# Patient Record
Sex: Male | Born: 1965 | Hispanic: No | Marital: Single | Smoking: Never smoker
Health system: Southern US, Community
[De-identification: ages and names within clinical notes are randomized; demographics above are authoritative.]

## PROBLEM LIST (undated history)

## (undated) HISTORY — PX: OTHER SURGICAL HISTORY: SHX169

---

## 2005-02-27 ENCOUNTER — Inpatient Hospital Stay (HOSPITAL_COMMUNITY): Admission: AD | Admit: 2005-02-27 | Discharge: 2005-03-01 | Payer: Self-pay | Admitting: Psychiatry

## 2005-02-28 ENCOUNTER — Ambulatory Visit: Payer: Self-pay | Admitting: Psychiatry

## 2005-03-21 ENCOUNTER — Ambulatory Visit (HOSPITAL_COMMUNITY): Payer: Self-pay | Admitting: Psychiatry

## 2006-08-05 ENCOUNTER — Encounter: Admission: RE | Admit: 2006-08-05 | Discharge: 2006-08-05 | Payer: Self-pay | Admitting: Internal Medicine
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
## 2013-01-11 ENCOUNTER — Encounter: Payer: Self-pay | Admitting: Gastroenterology

## 2013-03-21 ENCOUNTER — Other Ambulatory Visit: Payer: Self-pay | Admitting: Gastroenterology

## 2013-04-25 ENCOUNTER — Ambulatory Visit (AMBULATORY_SURGERY_CENTER): Payer: Self-pay | Admitting: *Deleted

## 2013-04-25 ENCOUNTER — Telehealth: Payer: Self-pay | Admitting: *Deleted

## 2013-04-25 VITALS — Ht 75.0 in | Wt 193.0 lb

## 2013-04-25 DIAGNOSIS — Z800 Family history of malignant neoplasm of digestive organs: Secondary | ICD-10-CM

## 2013-04-25 MED ORDER — MOVIPREP 100 G PO SOLR: ORAL | Status: DC

## 2013-04-25 NOTE — Telephone Encounter (Signed)
Dr Ardis Hughs: Pt is scheduled for direct colonoscopy referred by Dr. Brigitte Pulse.  He is 48 year old caucasian with family hx colon cancer in father at age 38.  Paternal grandmother with colon cancer at age 44.  Should pt have colonoscopy now?  Thanks, Juliann Pulse

## 2013-04-25 NOTE — Telephone Encounter (Signed)
Yes, he should have started colon cancer screening at age 48.  So now is good. t ahnks

## 2013-04-25 NOTE — Telephone Encounter (Signed)
Pt notified to proceed with colonoscopy as scheduled 

## 2013-04-25 NOTE — Progress Notes (Signed)
No allergies to eggs or soy. No prior anesthesia.  

## 2013-04-28 ENCOUNTER — Encounter: Payer: Self-pay | Admitting: Gastroenterology

## 2013-05-09 ENCOUNTER — Encounter: Payer: Self-pay | Admitting: Gastroenterology

## 2013-05-09 ENCOUNTER — Ambulatory Visit (AMBULATORY_SURGERY_CENTER): Payer: 59 | Admitting: Gastroenterology

## 2013-05-09 VITALS — BP 132/76 | HR 64 | Temp 97.9°F | Resp 19 | Ht 75.0 in | Wt 193.0 lb

## 2013-05-09 DIAGNOSIS — K5730 Diverticulosis of large intestine without perforation or abscess without bleeding: Secondary | ICD-10-CM

## 2013-05-09 DIAGNOSIS — Z1211 Encounter for screening for malignant neoplasm of colon: Secondary | ICD-10-CM

## 2013-05-09 DIAGNOSIS — D126 Benign neoplasm of colon, unspecified: Secondary | ICD-10-CM

## 2013-05-09 DIAGNOSIS — Z800 Family history of malignant neoplasm of digestive organs: Secondary | ICD-10-CM

## 2013-05-09 MED ORDER — SODIUM CHLORIDE 0.9 % IV SOLN: 500.0000 mL | INTRAVENOUS | Status: DC
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
## 2013-05-09 NOTE — Op Note (Signed)
[facility]  [address] [place], [address]   COLONOSCOPY PROCEDURE REPORT  PATIENT: Derek Hale, Derek Hale  MR#: [id number] BIRTHDATE: 04-13-1965 , 47  yrs. old GENDER: Male ENDOSCOPIST: Milus Banister, MD REFERRED BY:W.  Lutricia Feil, M.D. PROCEDURE DATE:  05/09/2013 PROCEDURE:   Colonoscopy with snare polypectomy First Screening Colonoscopy - Avg.  risk and is 50 yrs.  old or older - No.  Prior Negative Screening - Now for repeat screening. N/A  History of Adenoma - Now for follow-up colonoscopy & has been > or = to 3 yrs.  N/A  Polyps Removed Today? Yes. ASA CLASS:   Class II INDICATIONS:elevated risk screening, father had colon cancer (diagnosed in his 15s) MEDICATIONS: Fentanyl 50 mcg IV, Versed 6 mg IV, and These medications were titrated to patient response per physician's verbal order  DESCRIPTION OF PROCEDURE:   After the risks benefits and alternatives of the procedure were thoroughly explained, informed consent was obtained.  A digital rectal exam revealed no abnormalities of the rectum.   The LB [id number]  endoscope was introduced through the anus and advanced to the cecum, which was identified by both the appendix and ileocecal valve. No adverse events experienced.   The quality of the prep was excellent.  The instrument was then slowly withdrawn as the colon was fully examined.  COLON FINDINGS: Two polyps were found, removed and both were sent to pathology.  These were sessile, located is sigmoid section, 3-58mm across, removed with cold snare, sent to pathology.  There were a few small diverticulum in the left colon.  The examination was otherwise normal.  Retroflexed views revealed no abnormalities. The time to cecum=2 minutes 27 seconds.  Withdrawal time=13 minutes 24 seconds.  The scope was withdrawn and the procedure completed. COMPLICATIONS: There were no complications.  ENDOSCOPIC IMPRESSION: Two polyps were found, removed and both  were sent to pathology. These were sessile, located is sigmoid section, 3-7mm across, removed with cold snare, sent to pathology.  There were a few small diverticulum in the left colon.  The examination was otherwise normal.  RECOMMENDATIONS: Given your significant family history of colon cancer (father diagnosed in his 46s), you should have a repeat colonoscopy in 5 years even if the polyps removed today are not precancerous.   eSigned:  Milus Banister, MD 05/09/2013 9:02 AM

## 2013-05-09 NOTE — Patient Instructions (Signed)
YOU HAD AN ENDOSCOPIC PROCEDURE TODAY AT THE [facility]: Refer to the procedure report that was given to you for any specific questions about what was found during the examination.  If the procedure report does not answer your questions, please call your gastroenterologist to clarify.  If you requested that your care partner not be given the details of your procedure findings, then the procedure report has been included in a sealed envelope for you to review at your convenience later.  YOU SHOULD EXPECT: Some feelings of bloating in the abdomen. Passage of more gas than usual.  Walking can help get rid of the air that was put into your GI tract during the procedure and reduce the bloating. If you had a lower endoscopy (such as a colonoscopy or flexible sigmoidoscopy) you may notice spotting of blood in your stool or on the toilet paper. If you underwent a bowel prep for your procedure, then you may not have a normal bowel movement for a few days.  DIET: Your first meal following the procedure should be a light meal and then it is ok to progress to your normal diet.  A half-sandwich or bowl of soup is an example of a good first meal.  Heavy or fried foods are harder to digest and may make you feel nauseous or bloated.  Likewise meals heavy in dairy and vegetables can cause extra gas to form and this can also increase the bloating.  Drink plenty of fluids but you should avoid alcoholic beverages for 24 hours.  ACTIVITY: Your care partner should take you home directly after the procedure.  You should plan to take it easy, moving slowly for the rest of the day.  You can resume normal activity the day after the procedure however you should NOT DRIVE or use heavy machinery for 24 hours (because of the sedation medicines used during the test).    SYMPTOMS TO REPORT IMMEDIATELY: A gastroenterologist can be reached at any hour.  During normal business hours, 8:30 AM to 5:00 PM Monday through Friday,  call [phone].  After hours and on weekends, please call the GI answering service at [phone] who will take a message and have the physician on call contact you.   Following lower endoscopy (colonoscopy or flexible sigmoidoscopy):  Excessive amounts of blood in the stool  Significant tenderness or worsening of abdominal pains  Swelling of the abdomen that is new, acute  Fever of 100F or higher  FOLLOW UP: If any biopsies were taken you will be contacted by phone or by letter within the next 1-3 weeks.  Call your gastroenterologist if you have not heard about the biopsies in 3 weeks.  Our staff will call the home number listed on your records the next business day following your procedure to check on you and address any questions or concerns that you may have at that time regarding the information given to you following your procedure. This is a courtesy call and so if there is no answer at the home number and we have not heard from you through the emergency physician on call, we will assume that you have returned to your regular daily activities without incident.  SIGNATURES/CONFIDENTIALITY: You and/or your care partner have signed paperwork which will be entered into your electronic medical record.  These signatures attest to the fact that that the information above on your After Visit Summary has been reviewed and is understood.  Full responsibility of the confidentiality of this   discharge information lies with you and/or your care-partner.  Handout on polyps, diverticulosis, high fiber diet Repeat colon in 5 years

## 2013-05-10 ENCOUNTER — Telehealth: Payer: Self-pay

## 2013-05-10 NOTE — Telephone Encounter (Signed)
Attempt follow up call post procedure, no answer or vm to leave message

## 2013-05-27 ENCOUNTER — Telehealth: Payer: Self-pay | Admitting: Gastroenterology

## 2013-05-27 NOTE — Telephone Encounter (Signed)
I spoke with him today on the phone about the fact that the polyp tissue was lost. I explained that these were both small polyps 3-6 mm. They did not at all appear cancerous. I am very comfortable with my recommendation given his family history of colon cancer in his father that he needs a repeat colonoscopy in 5 years. He sent a very understanding of the issue that we have had here with the tissue. He told me that was "not a big deal". I did explain to them that I showed his case to 2 of my partners and a both completely agreed with the recommendations of recall colonoscopy in 5 years.  Patty, he needs recall colonoscopy in 5 years

## 2013-05-27 NOTE — Telephone Encounter (Signed)
Recall has been entered  

## 2018-05-13 ENCOUNTER — Encounter: Payer: Self-pay | Admitting: Gastroenterology
